# Patient Record
Sex: Female | Born: 1970 | Race: Black or African American | Hispanic: No | Marital: Single | Smoking: Never smoker
Health system: Southern US, Community
[De-identification: ages and names within clinical notes are randomized; demographics above are authoritative.]

## PROBLEM LIST (undated history)

## (undated) HISTORY — PX: EYE SURGERY: SHX253

---

## 2010-03-02 ENCOUNTER — Emergency Department (HOSPITAL_BASED_OUTPATIENT_CLINIC_OR_DEPARTMENT_OTHER): Admission: EM | Admit: 2010-03-02 | Discharge: 2010-03-02 | Payer: Self-pay | Admitting: Emergency Medicine

## 2016-09-08 ENCOUNTER — Ambulatory Visit (INDEPENDENT_AMBULATORY_CARE_PROVIDER_SITE_OTHER): Payer: BLUE CROSS/BLUE SHIELD | Admitting: Urgent Care

## 2016-09-08 ENCOUNTER — Encounter: Payer: Self-pay | Admitting: Urgent Care

## 2016-09-08 VITALS — BP 150/85 | HR 74 | Temp 97.2°F | Resp 18 | Ht 64.0 in | Wt 190.0 lb

## 2016-09-08 DIAGNOSIS — R059 Cough, unspecified: Secondary | ICD-10-CM

## 2016-09-08 DIAGNOSIS — J029 Acute pharyngitis, unspecified: Secondary | ICD-10-CM | POA: Diagnosis not present

## 2016-09-08 DIAGNOSIS — Z8679 Personal history of other diseases of the circulatory system: Secondary | ICD-10-CM

## 2016-09-08 DIAGNOSIS — J069 Acute upper respiratory infection, unspecified: Secondary | ICD-10-CM | POA: Diagnosis not present

## 2016-09-08 DIAGNOSIS — R030 Elevated blood-pressure reading, without diagnosis of hypertension: Secondary | ICD-10-CM | POA: Diagnosis not present

## 2016-09-08 DIAGNOSIS — R05 Cough: Secondary | ICD-10-CM | POA: Diagnosis not present

## 2016-09-08 MED ORDER — AZITHROMYCIN 250 MG PO TABS: ORAL_TABLET | ORAL | 0 refills | Status: DC

## 2016-09-08 MED ORDER — HYDROCODONE-HOMATROPINE 5-1.5 MG/5ML PO SYRP: 5.0000 mL | ORAL_SOLUTION | Freq: Every evening | ORAL | 0 refills | Status: DC | PRN

## 2016-09-08 MED ORDER — PSEUDOEPHEDRINE HCL 60 MG PO TABS: 60.0000 mg | ORAL_TABLET | Freq: Three times a day (TID) | ORAL | 0 refills | Status: DC | PRN

## 2016-09-08 MED ORDER — BENZONATATE 100 MG PO CAPS: 100.0000 mg | ORAL_CAPSULE | Freq: Three times a day (TID) | ORAL | 0 refills | Status: DC | PRN

## 2016-09-08 NOTE — Progress Notes (Signed)
  MRN: [id number] DOB: 10/22/1970  Subjective:   Caroline Dougherty is a 46 y.o. female presenting for chief complaint of Cough (x1 week; ) and Chest Pain (assoicated with the cough)  Reports 1 week history of nasal congestion, throat congestion, sore throat, bilateral ear popping, R>L, productive cough. Cough has started to elicit chest wall pain, mild and intermittent shob. Has also had occasional nausea. Has tried NyQuil and Vitamin C with minimal relief. Denies fever, chills, sinus pain, ear pain, abdominal pain. Has a history of mild seasonal allergies, does not take anything consistently for this. Denies history of asthma. Denies smoking cigarettes or drinking alcohol. [name] is not currently taking any medications. Also has No Known Allergies. Thia denies past medical history. Also  has a past surgical history that includes Eye surgery. Her family history includes Cancer in her mother; Hypertension in her brother, father, mother, and sister; Stroke in her mother. Her mother is currently undergoing treatment for recurrent endometrial cancer.  Objective:   Vitals: BP (!) 150/85   Pulse 74   Temp 97.2 F (36.2 C) (Oral)   Resp 18   Ht 5\' 4"  (1.626 m)   Wt 190 lb (86.2 kg)   SpO2 98%   BMI 32.61 kg/m   Physical Exam  Constitutional: She is oriented to person, place, and time. She appears well-developed and well-nourished.  HENT:  Right TM with an effusion but no erythema or drainage bilaterally. Nasal turbinates pink and moist, nasal passages patent, without sinus tenderness.  Moderate postnasal drip present but without oropharyngeal exudates, erythema or abscesses.  Eyes: Right eye exhibits no discharge. Left eye exhibits no discharge. No scleral icterus.  Neck: Normal range of motion. Neck supple.  Cardiovascular: Normal rate, regular rhythm and intact distal pulses.  Exam reveals no gallop and no friction rub.   No murmur heard. Pulmonary/Chest: No respiratory distress. She has no  wheezes. She has no rales.  Lymphadenopathy:    She has no cervical adenopathy.  Neurological: She is alert and oriented to person, place, and time.  Skin: Skin is warm and dry.  Psychiatric: She has a normal mood and affect.   Assessment and Plan :   1. Viral URI 2. Sore throat 3. Cough - Likely viral in nature, advised supportive care. - If no improvement or symptoms do not resolve return to clinic in 1 week.   4. Elevated blood pressure reading 5. History of hypertension - Will monitor, recheck in 4 weeks.  Wallis BambergMario Jamielynn Wigley, PA-C [facility] [phone] 09/08/2016  2:32 PM

## 2016-09-08 NOTE — Patient Instructions (Addendum)
Make sure you are hydrating very well, drink 1 gallon (2 liters) of water daily. Take Sudafed 60mg  as needed up to 3 times daily for post-nasal drainage, ear/throat/chest congestion. Check your blood pressure in 2-4 weeks and if it remains above 140 systolic (top number), then set up an office visit so we can help you with your blood pressure.    Cough, Adult Coughing is a reflex that clears your throat and your airways. Coughing helps to heal and protect your lungs. It is normal to cough occasionally, but a cough that happens with other symptoms or lasts a long time may be a sign of a condition that needs treatment. A cough may last only 2-3 weeks (acute), or it may last longer than 8 weeks (chronic). What are the causes? Coughing is commonly caused by:  Breathing in substances that irritate your lungs.  A viral or bacterial respiratory infection.  Allergies.  Asthma.  Postnasal drip.  Smoking.  Acid backing up from the stomach into the esophagus (gastroesophageal reflux).  Certain medicines.  Chronic lung problems, including COPD (or rarely, lung cancer).  Other medical conditions such as heart failure. Follow these instructions at home: Pay attention to any changes in your symptoms. Take these actions to help with your discomfort:  Take medicines only as told by your health care provider.  If you were prescribed an antibiotic medicine, take it as told by your health care provider. Do not stop taking the antibiotic even if you start to feel better.  Talk with your health care provider before you take a cough suppressant medicine.  Drink enough fluid to keep your urine clear or pale yellow.  If the air is dry, use a cold steam vaporizer or humidifier in your bedroom or your home to help loosen secretions.  Avoid anything that causes you to cough at work or at home.  If your cough is worse at night, try sleeping in a semi-upright position.  Avoid cigarette smoke. If you  smoke, quit smoking. If you need help quitting, ask your health care provider.  Avoid caffeine.  Avoid alcohol.  Rest as needed. Contact a health care provider if:  You have new symptoms.  You cough up pus.  Your cough does not get better after 2-3 weeks, or your cough gets worse.  You cannot control your cough with suppressant medicines and you are losing sleep.  You develop pain that is getting worse or pain that is not controlled with pain medicines.  You have a fever.  You have unexplained weight loss.  You have night sweats. Get help right away if:  You cough up blood.  You have difficulty breathing.  Your heartbeat is very fast. This information is not intended to replace advice given to you by your health care provider. Make sure you discuss any questions you have with your health care provider. Document Released: 09/26/2010 Document Revised: 09/05/2015 Document Reviewed: 06/06/2014 Elsevier Interactive Patient Education  2017 [employer]     IF you received an x-ray today, you will receive an invoice from [facility]. Please contact [facility] at [phone] with questions or concerns regarding your invoice.   IF you received labwork today, you will receive an invoice from [facility]. Please contact [facility] at [phone] with questions or concerns regarding your invoice.   Our billing staff will not be able to assist you with questions regarding bills from these companies.  You will be contacted with the lab results as soon as they are  available. The fastest way to get your results is to activate your My Chart account. Instructions are located on the last page of this paperwork. If you have not heard from us regarding the results in 2 weeks, please contact this office.      

## 2016-09-21 ENCOUNTER — Emergency Department (HOSPITAL_BASED_OUTPATIENT_CLINIC_OR_DEPARTMENT_OTHER): Payer: BLUE CROSS/BLUE SHIELD

## 2016-09-21 ENCOUNTER — Emergency Department (HOSPITAL_BASED_OUTPATIENT_CLINIC_OR_DEPARTMENT_OTHER)
Admission: EM | Admit: 2016-09-21 | Discharge: 2016-09-21 | Disposition: A | Discharge status: Home / Self Care | Payer: BLUE CROSS/BLUE SHIELD | Attending: Emergency Medicine | Admitting: Emergency Medicine

## 2016-09-21 ENCOUNTER — Encounter (HOSPITAL_BASED_OUTPATIENT_CLINIC_OR_DEPARTMENT_OTHER): Payer: Self-pay

## 2016-09-21 DIAGNOSIS — K5901 Slow transit constipation: Secondary | ICD-10-CM | POA: Diagnosis not present

## 2016-09-21 DIAGNOSIS — R1084 Generalized abdominal pain: Secondary | ICD-10-CM

## 2016-09-21 DIAGNOSIS — K649 Unspecified hemorrhoids: Secondary | ICD-10-CM | POA: Insufficient documentation

## 2016-09-21 DIAGNOSIS — K5900 Constipation, unspecified: Secondary | ICD-10-CM | POA: Diagnosis present

## 2016-09-21 MED ORDER — POLYETHYLENE GLYCOL 3350 17 G PO PACK: 17.0000 g | PACK | Freq: Every day | ORAL | 0 refills | Status: AC

## 2016-09-21 MED ORDER — HYDROCORTISONE 2.5 % RE CREA: 1.0000 "application " | TOPICAL_CREAM | Freq: Two times a day (BID) | RECTAL | 0 refills | Status: AC

## 2016-09-21 MED ORDER — FLEET ENEMA 7-19 GM/118ML RE ENEM: 1.0000 | ENEMA | Freq: Every day | RECTAL | 0 refills | Status: AC | PRN

## 2016-09-21 MED ORDER — SENNOSIDES-DOCUSATE SODIUM 8.6-50 MG PO TABS: 1.0000 | ORAL_TABLET | Freq: Two times a day (BID) | ORAL | 0 refills | Status: AC

## 2016-09-21 NOTE — ED Provider Notes (Signed)
Emergency Department Provider Note  By signing my name below, I, Soijett Blue, attest that this documentation has been prepared under the direction and in the presence of Long, Arlyss Repress, MD. Electronically Signed: Soijett Blue, ED Scribe. 09/21/16. 9:49 PM.  I have reviewed the triage vital signs and the nursing notes.   HISTORY  Chief Complaint Constipation   HPI Caroline Dougherty is a 46 y.o. female who presents to the Emergency Department complaining of constipation onset 4 days ago. Pt reports associated resolved vomiting and hemorrhoids. Pt has tried tylenol, 2 stool softeners, OTC "smooth move" tea, magnesium citrate, suppositories, and OTC hemorrhoid creams with no relief of her symptoms. She notes that her last normal bowel movement was 4 days ago. Pt reports that it is abnormal for her to have constipation occur this long, but notes that she has had a recent increase in life stressors. She denies fever, chills, and any other symptoms.     History reviewed. No pertinent past medical history.  There are no active problems to display for this patient.   Past Surgical History:  Procedure Laterality Date  . EYE SURGERY      Current Outpatient Rx  . Order #: [id number] Class: Historical Med  . Order #: [id number] Class: Print  . Order #: [id number] Class: Print  . Order #: [id number] Class: Print  . Order #: [id number] Class: Print    Allergies Patient has no known allergies.  Family History  Problem Relation Age of Onset  . Cancer Mother   . Hypertension Mother   . Stroke Mother   . Hypertension Father   . Hypertension Sister   . Hypertension Brother     Social History Social History  Substance Use Topics  . Smoking status: Never Smoker  . Smokeless tobacco: Never Used  . Alcohol use No    Review of Systems Constitutional: No fever/chills Eyes: No visual changes. ENT: No sore throat. Cardiovascular: Denies chest pain. Respiratory: Denies shortness of  breath. Gastrointestinal: No abdominal pain.  No nausea. Positive for resolved vomiting.  No diarrhea.  Positive for constipation and hemorrhoids.  Genitourinary: Negative for dysuria. Musculoskeletal: Negative for back pain. Skin: Negative for rash. Neurological: Negative for headaches, focal weakness or numbness.  10-point ROS otherwise negative.  ____________________________________________   PHYSICAL EXAM:  VITAL SIGNS: ED Triage Vitals  Enc Vitals Group     BP 09/21/16 2046 (!) 149/82     Pulse Rate 09/21/16 2046 74     Resp 09/21/16 2046 20     Temp 09/21/16 2046 98.5 F (36.9 C)     Temp Source 09/21/16 2046 Oral     SpO2 09/21/16 2046 99 %     Weight 09/21/16 2046 189 lb 9 oz (86 kg)     Pain Score 09/21/16 2045 8   Constitutional: Alert and oriented. Well appearing and in no acute distress. Eyes: Conjunctivae are normal. Head: Atraumatic. Nose: No congestion/rhinnorhea. Mouth/Throat: Mucous membranes are dry. Neck: No stridor.   Cardiovascular: Normal rate, regular rhythm. Good peripheral circulation. Grossly normal heart sounds.   Respiratory: Normal respiratory effort.  No retractions. Lungs CTAB. Gastrointestinal: Soft and nontender. No distention. Rectal exam with external hemorrhoids noted. No thrombosed external hemorrhoids. No palpable stool ball or fecal impaction able to be manually extracted.  Musculoskeletal: No lower extremity tenderness nor edema. No gross deformities of extremities. Neurologic:  Normal speech and language. No gross focal neurologic deficits are appreciated.  Skin:  Skin is warm, dry and intact. No rash noted.  ____________________________________________  RADIOLOGY  Dg Abdomen Acute W/chest  Result Date: 09/21/2016 CLINICAL DATA:  Rectal and hemorrhoidal pain x5 days. No bowel movements in 5 days. EXAM: DG ABDOMEN ACUTE W/ 1V CHEST COMPARISON:  None. FINDINGS: There is a stool ball in the mid pelvis which may reflect possible mild  fecal impaction. Liquid stool noted within large bowel more proximal to this. No small bowel dilatation of note. There is no free air. Heart size and mediastinal contours are within normal limits. Both lungs are clear. IMPRESSION: Small stool ball in the pelvis may reflect fecal impaction. Air-fluid levels within large bowel leading up to the fecal impaction/ stool ball. Findings may represent diarrheal disease more proximally. Electronically Signed   By: Tollie Ethavid  Kwon M.D.   On: 09/21/2016 22:34    ____________________________________________   PROCEDURES  Procedure(s) performed:   Procedures  None ____________________________________________   INITIAL IMPRESSION / ASSESSMENT AND PLAN / ED COURSE  Pertinent labs & imaging results that were available during my care of the patient were reviewed by me and considered in my medical decision making (see chart for details).  Patient presents to the ED with severe constipation and rectal pain. Noted to have hemorrhoids on exam. Plan for plain film for assess stool burden. Patient has been using mild laxatives with no success.   10:41 PM Plain film reviewed. No palpable stool ball on exam for manual disimpaction. Plan for aggressive bowel medication at home with Miralax, Pericolace, and Enema at home. Discussed use with patient in detail along with need to stay hydrated. Discussed return precautions.   At this time, I do not feel there is any life-threatening condition present. I have reviewed and discussed all results (EKG, imaging, lab, urine as appropriate), exam findings with patient. I have reviewed nursing notes and appropriate previous records.  I feel the patient is safe to be discharged home without further emergent workup. Discussed usual and customary return precautions. Patient and family (if present) verbalize understanding and are comfortable with this plan.  Patient will follow-up with their primary care provider. If they do not have a  primary care provider, information for follow-up has been provided to them. All questions have been answered.  ____________________________________________  FINAL CLINICAL IMPRESSION(S) / ED DIAGNOSES  Final diagnoses:  Slow transit constipation  Generalized abdominal pain  Hemorrhoids, unspecified hemorrhoid type     MEDICATIONS GIVEN DURING THIS VISIT:  Medications - No data to display   NEW OUTPATIENT MEDICATIONS STARTED DURING THIS VISIT:  Discharge Medication List as of 09/21/2016 10:52 PM    START taking these medications   Details  hydrocortisone (ANUSOL-HC) 2.5 % rectal cream Place 1 application rectally 2 (two) times daily., Starting Mon 09/21/2016, Print    polyethylene glycol (MIRALAX) packet Take 17 g by mouth daily., Starting Mon 09/21/2016, Print    senna-docusate (SENOKOT-S) 8.6-50 MG tablet Take 1 tablet by mouth 2 (two) times daily., Starting Mon 09/21/2016, Until Sat 09/26/2016, Print    sodium phosphate (FLEET) 7-19 GM/118ML ENEM Place 133 mLs (1 enema total) rectally daily as needed for severe constipation., Starting Mon 09/21/2016, Until Sat 09/26/2016, Print        Note:  This document was prepared using Dragon voice recognition software and may include unintentional dictation errors.  Alona BeneJoshua Long, MD Emergency Medicine   I personally performed the services described in this documentation, which was scribed in my presence. The recorded information has been reviewed and is accurate.       Long, Ivin BootyJoshua  G, MD 09/22/16 [phone]

## 2016-09-21 NOTE — ED Notes (Signed)
Patient transported to X-ray 

## 2016-09-21 NOTE — Discharge Instructions (Signed)
You were seen in the ED today with abdominal pain and constipation. You have a large amount of stool in the rectum but we were unable to reach it manually. We are prescribing medication to soften and stimulate a bowel movement. Use as directed and stay hydrated with water. Use the rectal cream to help shrink the hemorrhoids. Follow up with your PCP as needed.   Return to the ED with any sudden worsening rectal pain or abdominal pain.

## 2016-09-21 NOTE — ED Triage Notes (Signed)
C/o constipation and hemorrhoids-last normal BM 6/7-NAD-slow steady gait

## 2016-09-21 NOTE — ED Notes (Signed)
Pt amb to BR for urine spec

## 2018-05-09 IMAGING — DX DG ABDOMEN ACUTE W/ 1V CHEST
3 series · 3 of 3 positions shown · non-contrast
Comparison: None.

CLINICAL DATA: Rectal and hemorrhoidal pain x5 days. No bowel
movements in 5 days.

EXAM:
DG ABDOMEN ACUTE W/ 1V CHEST

[chest pa]
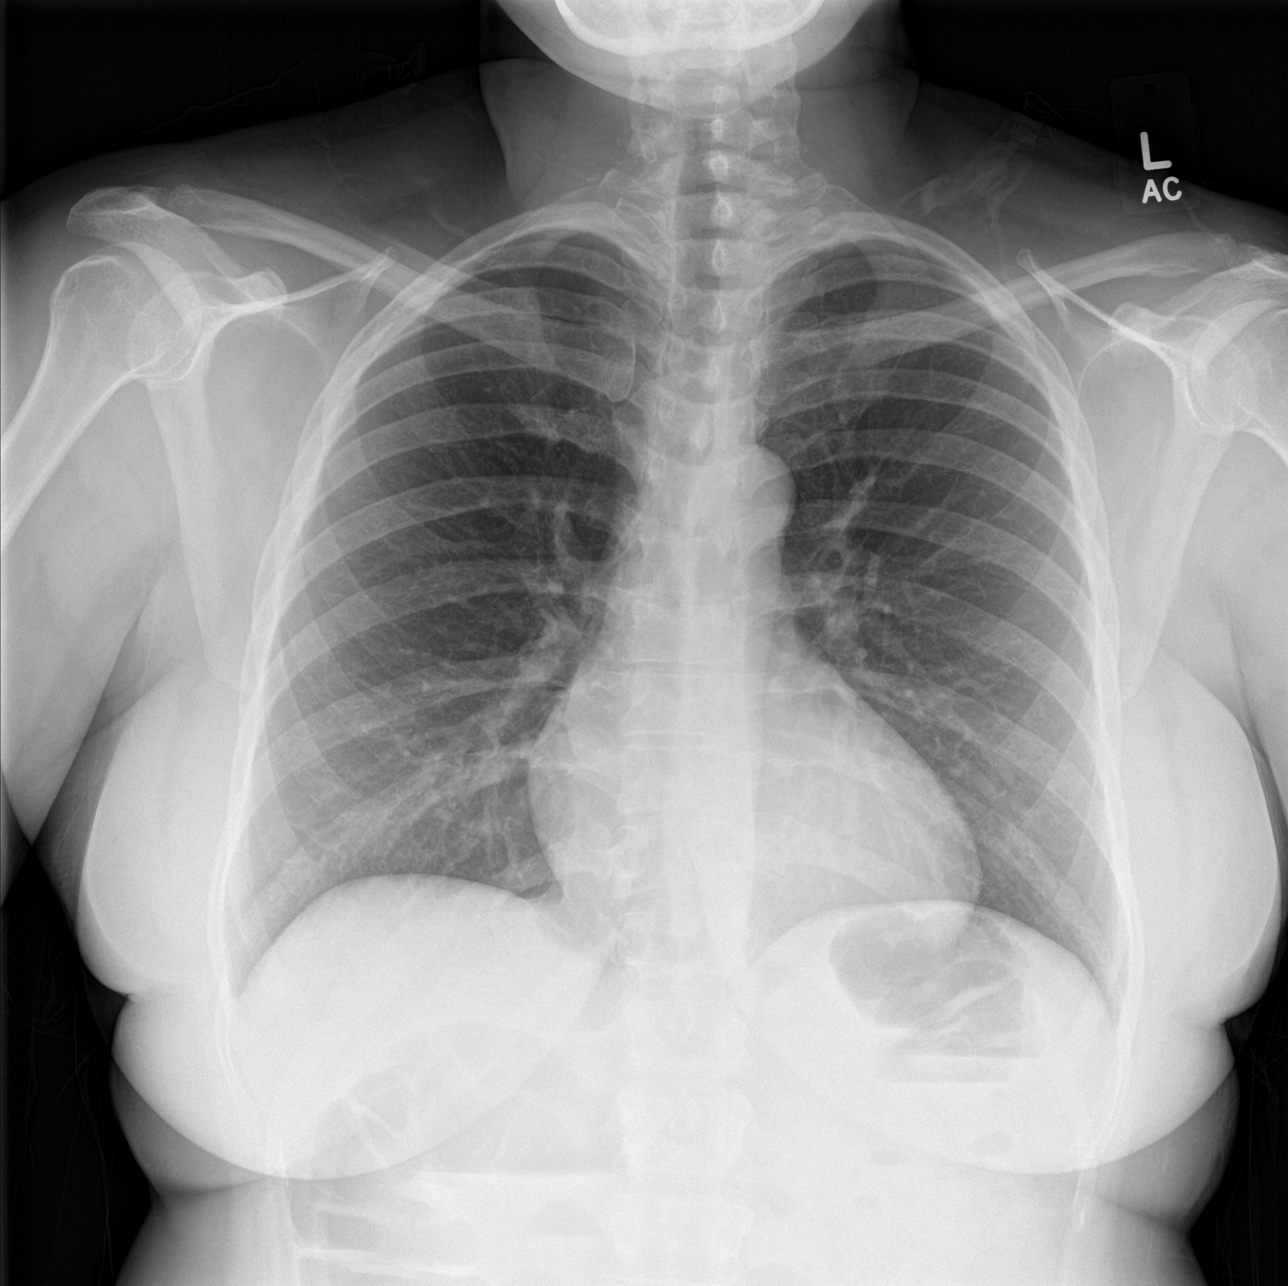

[abdomen erect]
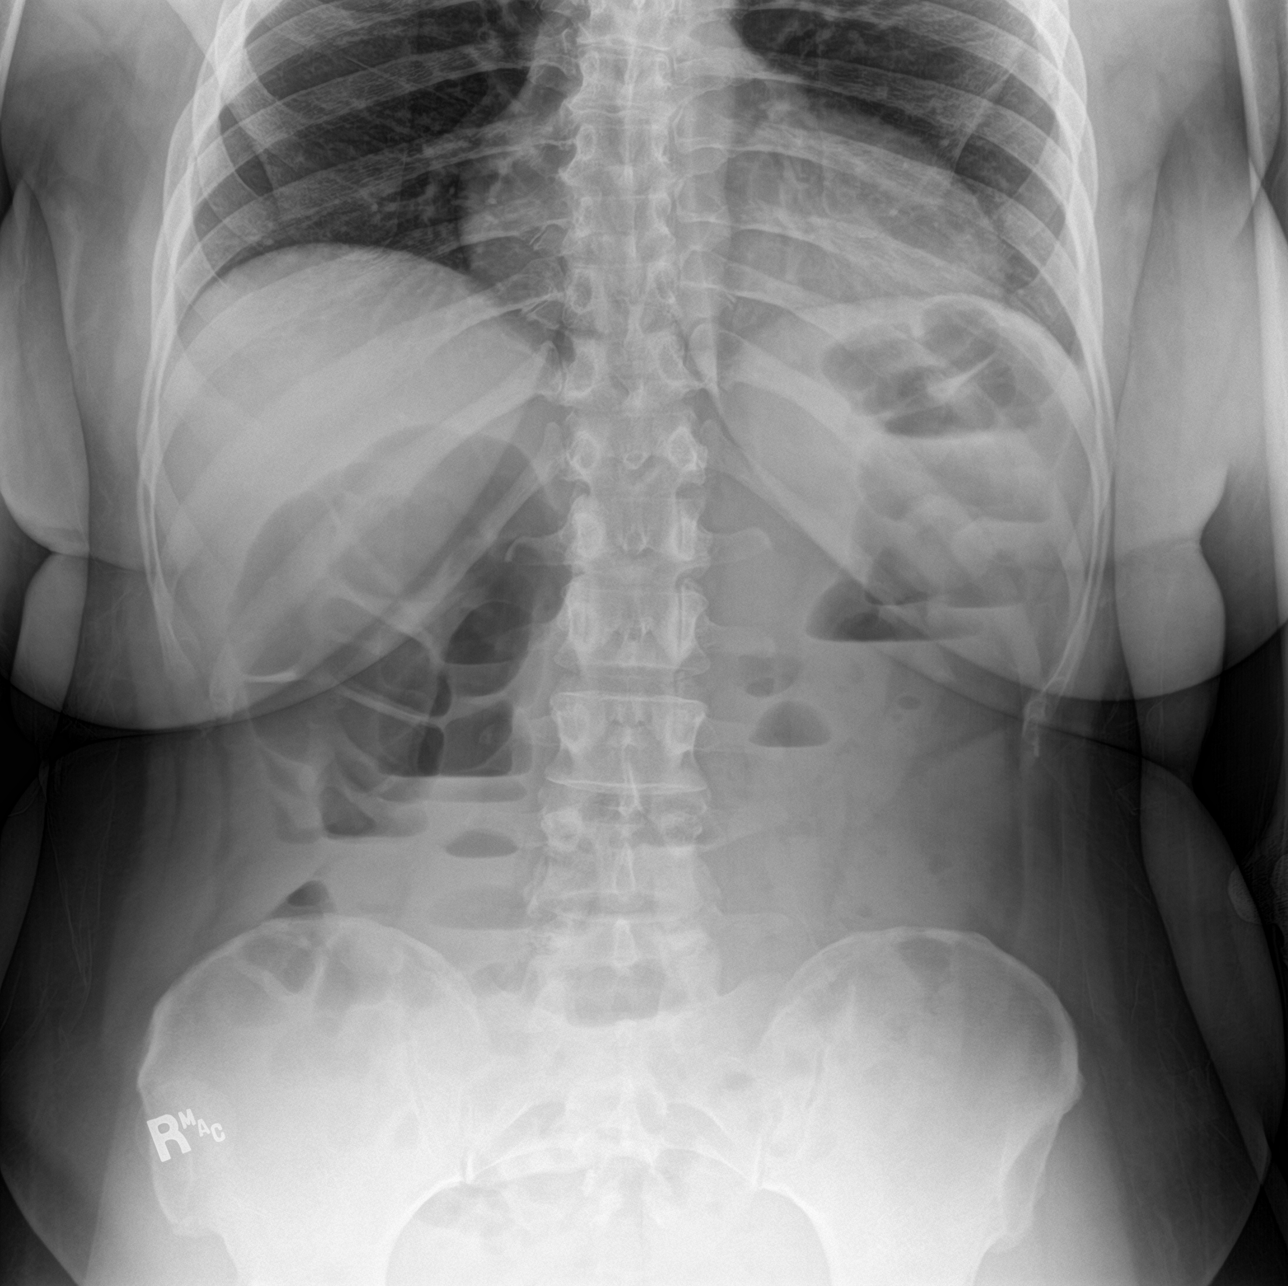

[abdomen supine]
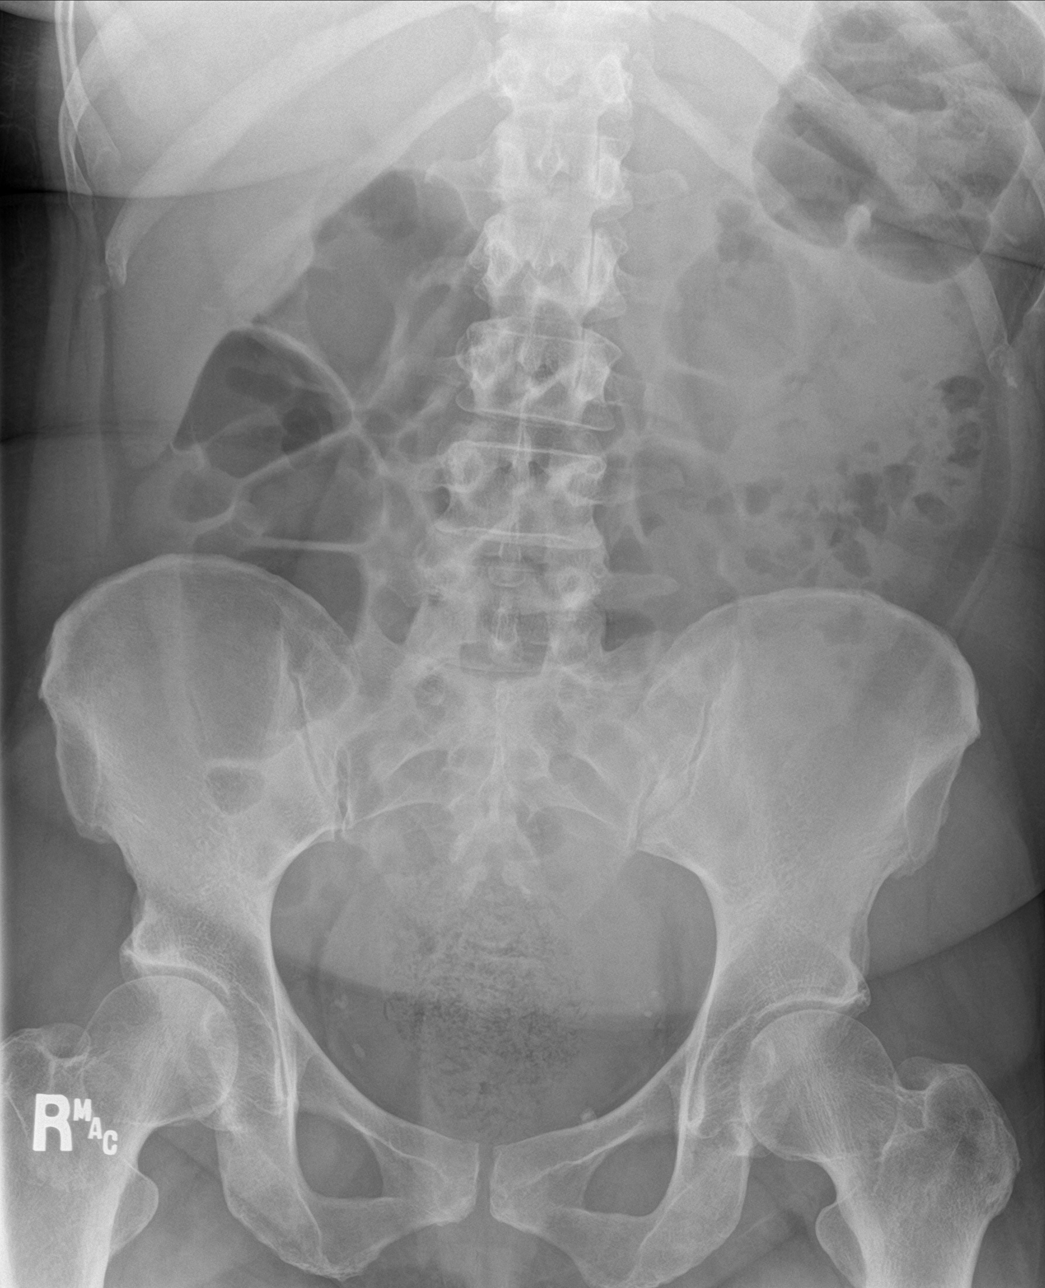

[3 of 3 positions shown; findings below may reference images not displayed]

FINDINGS: There is a stool ball in the mid pelvis which may reflect possible
mild fecal impaction. Liquid stool noted within large bowel more
proximal to this. No small bowel dilatation of note. There is no
free air. Heart size and mediastinal contours are within normal
limits. Both lungs are clear.
IMPRESSION: Small stool ball in the pelvis may reflect fecal impaction.
Air-fluid levels within large bowel leading up to the fecal
impaction/ stool ball. Findings may represent diarrheal disease more
proximally.
